# Patient Record
Sex: Male | Born: 1975 | Race: White | Hispanic: No | Marital: Married | Smoking: Never smoker
Health system: Southern US, Community
[De-identification: ages and names within clinical notes are randomized; demographics above are authoritative.]

---

## 2020-11-04 ENCOUNTER — Encounter (HOSPITAL_COMMUNITY): Payer: Self-pay | Admitting: Pharmacy Technician

## 2020-11-04 ENCOUNTER — Emergency Department (HOSPITAL_COMMUNITY)
Admission: EM | Admit: 2020-11-04 | Discharge: 2020-11-04 | Disposition: A | Discharge status: Home / Self Care | Payer: Self-pay | Attending: Physician Assistant | Admitting: Physician Assistant

## 2020-11-04 DIAGNOSIS — Z20822 Contact with and (suspected) exposure to covid-19: Secondary | ICD-10-CM | POA: Insufficient documentation

## 2020-11-04 DIAGNOSIS — R5383 Other fatigue: Secondary | ICD-10-CM | POA: Insufficient documentation

## 2020-11-04 DIAGNOSIS — R531 Weakness: Secondary | ICD-10-CM | POA: Insufficient documentation

## 2020-11-04 DIAGNOSIS — R1011 Right upper quadrant pain: Secondary | ICD-10-CM | POA: Insufficient documentation

## 2020-11-04 DIAGNOSIS — G4700 Insomnia, unspecified: Secondary | ICD-10-CM | POA: Insufficient documentation

## 2020-11-04 DIAGNOSIS — Z5321 Procedure and treatment not carried out due to patient leaving prior to being seen by health care provider: Secondary | ICD-10-CM | POA: Insufficient documentation

## 2020-11-04 DIAGNOSIS — R110 Nausea: Secondary | ICD-10-CM | POA: Insufficient documentation

## 2020-11-04 LAB — COMPREHENSIVE METABOLIC PANEL
ALT: 23 U/L (ref 0–44)
AST: 22 U/L (ref 15–41)
Albumin: 4 g/dL (ref 3.5–5.0)
Alkaline Phosphatase: 52 U/L (ref 38–126)
Anion gap: 8 (ref 5–15)
BUN: 7 mg/dL (ref 6–20)
CO2: 28 mmol/L (ref 22–32)
Calcium: 9.7 mg/dL (ref 8.9–10.3)
Chloride: 101 mmol/L (ref 98–111)
Creatinine, Ser: 1.03 mg/dL (ref 0.61–1.24)
GFR, Estimated: >60 mL/min (ref >60)
Glucose, Bld: 104 mg/dL — ABNORMAL HIGH (ref 70–99)
Potassium: 4.4 mmol/L (ref 3.5–5.1)
Sodium: 137 mmol/L (ref 135–145)
Total Bilirubin: 0.2 mg/dL — ABNORMAL LOW (ref 0.3–1.2)
Total Protein: 7.2 g/dL (ref 6.5–8.1)

## 2020-11-04 LAB — CBC
HCT: 49.4 % (ref 39.0–52.0)
Hemoglobin: 16.9 g/dL (ref 13.0–17.0)
MCH: 29.7 pg (ref 26.0–34.0)
MCHC: 34.2 g/dL (ref 30.0–36.0)
MCV: 86.8 fL (ref 80.0–100.0)
Platelets: 319 10*3/uL (ref 150–400)
RBC: 5.69 MIL/uL (ref 4.22–5.81)
RDW: 11.9 % (ref 11.5–15.5)
WBC: 9.6 10*3/uL (ref 4.0–10.5)
nRBC: 0 % (ref 0.0–0.2)

## 2020-11-04 LAB — RESP PANEL BY RT-PCR (FLU A&B, COVID) ARPGX2
Influenza A by PCR: NEGATIVE
Influenza B by PCR: NEGATIVE
SARS Coronavirus 2 by RT PCR: NEGATIVE

## 2020-11-04 LAB — LIPASE, BLOOD: Lipase: 55 U/L — ABNORMAL HIGH (ref 11–51)

## 2020-11-04 NOTE — ED Provider Notes (Signed)
Emergency Medicine Provider Triage Evaluation Note  Jennifer Holland , a 45 y.o. male  was evaluated in triage.  Pt complains of weakness, excessive sleepiness, abdominal pain with nausea.  Patient reports waking up on Monday and feeling like "someone poured hot water over him".  He reports chills, no measured fevers.  He has been feeling weak and tired.  He also complains of right upper and left upper abdominal pain.  He denies any dysuria or hematuria.  Bowel movements have been loose and diarrhea-like.  He is not vaccinated for COVID.  No known sick contacts.  He does report a history of appendectomy.  Did take at home COVID test yesterday which was negative  Review of Systems  Positive: As above Negative: As above  Physical Exam  BP (!) 140/102 (BP Location: Right Arm)   Pulse 96   Temp 98.8 F (37.1 C) (Oral)   Resp (!) 24   SpO2 100%  Gen:   Awake, no distress   Resp:  Normal effort  MSK:   Moves extremities without difficulty  Other:  Patient with right upper and left upper quadrant tenderness, positive Murphy sign  Medical Decision Making  Medically screening exam initiated at 1:54 PM.  Appropriate orders placed.  Raudel Bazen was informed that the remainder of the evaluation will be completed by another provider, this initial triage assessment does not replace that evaluation, and the importance of remaining in the ED until their evaluation is complete.     Mare Ferrari, PA-C 11/04/20 1359    Melene Plan, DO 11/04/20 1401

## 2020-11-04 NOTE — ED Triage Notes (Signed)
Pt here with RUQ abd pain along with fatigue. Denies known sick contacts.

## 2020-11-04 NOTE — ED Notes (Signed)
Pt not responding to vitals   

## 2020-11-04 NOTE — ED Notes (Signed)
Pt not responding to vital recheck  

## 2020-11-10 ENCOUNTER — Encounter (HOSPITAL_COMMUNITY): Payer: Self-pay

## 2020-11-10 ENCOUNTER — Other Ambulatory Visit: Payer: Self-pay

## 2020-11-10 ENCOUNTER — Emergency Department (HOSPITAL_COMMUNITY): Payer: Medicaid Other

## 2020-11-10 ENCOUNTER — Emergency Department (HOSPITAL_COMMUNITY)
Admission: EM | Admit: 2020-11-10 | Discharge: 2020-11-10 | Disposition: A | Discharge status: Home / Self Care | Payer: Medicaid Other | Attending: Emergency Medicine | Admitting: Emergency Medicine

## 2020-11-10 DIAGNOSIS — W540XXA Bitten by dog, initial encounter: Secondary | ICD-10-CM

## 2020-11-10 DIAGNOSIS — S61512A Laceration without foreign body of left wrist, initial encounter: Secondary | ICD-10-CM | POA: Insufficient documentation

## 2020-11-10 DIAGNOSIS — S41111A Laceration without foreign body of right upper arm, initial encounter: Secondary | ICD-10-CM | POA: Insufficient documentation

## 2020-11-10 DIAGNOSIS — S51812A Laceration without foreign body of left forearm, initial encounter: Secondary | ICD-10-CM | POA: Insufficient documentation

## 2020-11-10 DIAGNOSIS — R000 Tachycardia, unspecified: Secondary | ICD-10-CM | POA: Insufficient documentation

## 2020-11-10 MED ORDER — AMOXICILLIN-POT CLAVULANATE 875-125 MG PO TABS: 1.0000 | ORAL_TABLET | Freq: Two times a day (BID) | ORAL | 0 refills | Status: AC

## 2020-11-10 MED ORDER — SODIUM CHLORIDE 0.9 % IV SOLN
3.0000 g | Freq: Once | INTRAVENOUS | Status: AC
  Administered 2020-11-10: 3 g via INTRAVENOUS
  Filled 2020-11-10: qty 8

## 2020-11-10 MED ORDER — BUPIVACAINE HCL (PF) 0.5 % IJ SOLN
10.0000 mL | Freq: Once | INTRAMUSCULAR | Status: AC
  Administered 2020-11-10: 10 mL
  Filled 2020-11-10: qty 30

## 2020-11-10 MED ORDER — LACTATED RINGERS IV BOLUS
1000.0000 mL | Freq: Once | INTRAVENOUS | Status: AC
  Administered 2020-11-10: 1000 mL via INTRAVENOUS

## 2020-11-10 MED ORDER — MORPHINE SULFATE (PF) 4 MG/ML IV SOLN
4.0000 mg | Freq: Once | INTRAVENOUS | Status: AC
  Administered 2020-11-10: 4 mg via INTRAVENOUS
  Filled 2020-11-10: qty 1

## 2020-11-10 MED ORDER — ONDANSETRON HCL 4 MG/2ML IJ SOLN
4.0000 mg | Freq: Once | INTRAMUSCULAR | Status: AC
  Administered 2020-11-10: 4 mg via INTRAVENOUS
  Filled 2020-11-10: qty 2

## 2020-11-10 MED ORDER — LIDOCAINE-EPINEPHRINE (PF) 2 %-1:200000 IJ SOLN
10.0000 mL | Freq: Once | INTRAMUSCULAR | Status: AC
  Administered 2020-11-10: 10 mL
  Filled 2020-11-10: qty 20

## 2020-11-10 MED ORDER — HYDROCODONE-ACETAMINOPHEN 5-325 MG PO TABS: 1.0000 | ORAL_TABLET | Freq: Four times a day (QID) | ORAL | 0 refills | Status: AC | PRN

## 2020-11-10 NOTE — ED Notes (Signed)
PA at bedside suturing wounds

## 2020-11-10 NOTE — ED Triage Notes (Signed)
Arrived POV. Pt was bit by neighbors dog today around 5pm. Bite to Right inner elbow, left wrist, and left upper forearm. Bleeding is controlled, puncture wounds are covered.

## 2020-11-10 NOTE — ED Notes (Signed)
Pt to X Ray.

## 2020-11-10 NOTE — Discharge Instructions (Addendum)
  Wound Care - Laceration You may remove the bandage after 24 hours. Clean the wound and surrounding area gently with tap water and mild soap. Rinse well and blot dry. Do not scrub the wound, as this may cause the wound edges to come apart. You may shower, but avoid submerging the wound, such as with a bath or swimming. Clean the wound daily to prevent infection. Do not use cleaners such as hydrogen peroxide or alcohol.   Scar reduction: Application of a topical antibiotic ointment, such as Neosporin, after the wound has begun to close and heal well can decrease scab formation and reduce scarring. After the wound has healed and wound closures have been removed, application of ointments such as Aquaphor can also reduce scar formation.  The key to scar reduction is keeping the skin well hydrated and supple. Drinking plenty of water throughout the day (At least eight 8oz glasses of water a day) is essential to staying well hydrated.  Sun exposure: Keep the wound out of the sun. After the wound has healed, continue to protect it from the sun by wearing protective clothing or applying sunscreen.  Pain: You may use Tylenol, naproxen, or ibuprofen for pain. Antiinflammatory medications: Take 600 mg of ibuprofen every 6 hours or 440 mg (over the counter dose) to 500 mg (prescription dose) of naproxen every 12 hours for the next 3 days. After this time, these medications may be used as needed for pain. Take these medications with food to avoid upset stomach. Choose only one of these medications, do not take them together. Acetaminophen (generic for Tylenol): Should you continue to have additional pain while taking the ibuprofen or naproxen, you may add in acetaminophen as needed. Your daily total maximum amount of acetaminophen from all sources should be limited to 4000mg /day for persons without liver problems, or 2000mg /day for those with liver problems.  Vicodin: May take Vicodin (hydrocodone-acetaminophen) as  needed for severe pain.   Do not drive or perform other dangerous activities while taking this medication as it can cause drowsiness as well as changes in reaction time and judgement.   Please note that each pill of Vicodin contains 325 mg of acetaminophen (generic for Tylenol) and the above dosage limits apply.  Suture/staple removal: The sutures should dissolve and fall out within 14 days.  If they are still present after this, they may be removed.  Follow-up: Follow-up with the hand specialist on this matter.  Call to make an appointment.  Return to the ED sooner should the wound edges come apart or signs of infection arise, such as spreading redness, puffiness/swelling, pus draining from the wound, severe increase in pain, fever over 100.61F, or any other major issues.  For prescription assistance, may try using prescription discount sites or apps, such as goodrx.com

## 2020-11-10 NOTE — ED Provider Notes (Signed)
[facility]-EMERGENCY DEPT Provider Note   CSN: [id number] Arrival date & time: 11/10/20  1850     History Chief Complaint  Patient presents with  . Animal Bite    Adam Horne is a 45 y.o. male.  HPI      Adam Horne is a 45 y.o. male, patient with no known past medical history, presenting to the ED with injuries from dog bites that occurred around 5:30 PM this evening  Patient was bit multiple times by a neighbors pitbull.  He notes wounds and associated pain to the bilateral upper extremities.  The pain is worst in the left wrist and forearm, throbbing, severe, radiating into the hand. States the dog was confirmed to be up-to-date on rabies vaccination.  He confirms he is up-to-date on tetanus vaccination.  Denies numbness, weakness, other injuries.  History reviewed. No pertinent past medical history.  There are no problems to display for this patient.   History reviewed. No pertinent surgical history.     History reviewed. No pertinent family history.  Social History   Tobacco Use  . Smoking status: Never Smoker  . Smokeless tobacco: Never Used  Substance Use Topics  . Alcohol use: Never  . Drug use: Never    Home Medications Prior to Admission medications   Medication Sig Start Date End Date Taking? Authorizing Provider  amoxicillin-clavulanate (AUGMENTIN) 875-125 MG tablet Take 1 tablet by mouth every 12 (twelve) hours for 10 days. 11/10/20 11/20/20 Yes Macio Kissoon C, PA-C  HYDROcodone-acetaminophen (NORCO/VICODIN) 5-325 MG tablet Take 1 tablet by mouth every 6 (six) hours as needed for severe pain. 11/10/20  Yes Taysia Rivere C, PA-C    Allergies    Penicillins  Review of Systems   Review of Systems  Constitutional: Negative for diaphoresis.  Respiratory: Negative for shortness of breath.   Cardiovascular: Negative for chest pain.  Gastrointestinal: Negative for nausea and vomiting.  Musculoskeletal: Positive for arthralgias.  Skin:  Positive for wound.  Neurological: Negative for dizziness, weakness and numbness.  All other systems reviewed and are negative.   Physical Exam Updated Vital Signs BP (!) 169/111   Pulse (!) 132   Temp 99.6 F (37.6 C) (Oral)   Resp 18   Ht 5\' 8"  (1.727 m)   Wt 78 kg   SpO2 95%   BMI 26.15 kg/m   Physical Exam Vitals and nursing note reviewed.  Constitutional:      General: He is not in acute distress.    Appearance: He is well-developed. He is not diaphoretic.  HENT:     Head: Normocephalic and atraumatic.     Mouth/Throat:     Mouth: Mucous membranes are moist.     Pharynx: Oropharynx is clear.  Eyes:     Conjunctiva/sclera: Conjunctivae normal.  Neck:     Comments: No signs of injury to the patient's neck. Cardiovascular:     Rate and Rhythm: Regular rhythm. Tachycardia present.     Pulses: Normal pulses.          Radial pulses are 2+ on the right side and 2+ on the left side.     Comments: Tactile temperature in the extremities appropriate and equal bilaterally. Pulmonary:     Effort: Pulmonary effort is normal. No respiratory distress.  Abdominal:     Tenderness: There is no guarding.  Musculoskeletal:     Cervical back: Neck supple.     Comments: Approximately 1.5 cm laceration to the right upper extremity just proximal  to the elbow.  Surrounding bruising and tenderness noted.  1 cm laceration to the left forearm, as shown in photos.  Surrounding swelling and tenderness.  Approximately 1.5 cm laceration to the left wrist, as shown.  Surrounding swelling and tenderness.  No noted deformity or instability.  Full range of motion through the cardinal directions demonstrated in the bilateral shoulders, elbows, wrists, and fingers.  Scattered abrasions, bruising, tiny punctures to the left forearm.  Overall trauma exam performed without any abnormalities noted other than those mentioned.  Skin:    General: Skin is warm and dry.     Capillary Refill: Capillary  refill takes less than 2 seconds.  Neurological:     Mental Status: He is alert.     Comments: Sensation grossly intact to light touch through each of the nerve distributions of the bilateral upper extremities. Abduction and adduction of the fingers intact against resistance. Grip strength equal bilaterally. Supination and pronation intact against resistance. Strength 5/5 through the cardinal directions of the bilateral wrists. Strength 5/5 with flexion and extension of the bilateral elbows. Patient can touch the thumb to each one of the fingertips without difficulty.  Patient can hold the "OK" sign against resistance.  Psychiatric:        Mood and Affect: Mood and affect normal.        Speech: Speech normal.        Behavior: Behavior normal.                                      ED Results / Procedures / Treatments   Labs (all labs ordered are listed, but only abnormal results are displayed) Labs Reviewed - No data to display  EKG None  Radiology DG Forearm Left  Result Date: 11/10/2020 CLINICAL DATA:  45 year old male with dog bite to the left upper extremity. EXAM: LEFT WRIST - COMPLETE 3+ VIEW; LEFT FOREARM - 2 VIEW; LEFT HAND - COMPLETE 3+ VIEW COMPARISON:  None. FINDINGS: There is no acute fracture or dislocation. The bones are well mineralized. No arthritic changes. There is laceration of the soft tissues of the forearm and wrist with small pockets of subcutaneous air. No radiopaque foreign object. IMPRESSION: No acute/traumatic osseous pathology. Electronically Signed   By: Elgie CollardArash  Radparvar M.D.   On: 11/10/2020 20:54   DG Forearm Right  Result Date: 11/10/2020 CLINICAL DATA:  Dog bite. EXAM: RIGHT FOREARM - 2 VIEW COMPARISON:  None. FINDINGS: There is no evidence of fracture or other focal bone lesions. Soft tissues are unremarkable. No radiopaque foreign body is noted. IMPRESSION: Negative. Electronically Signed   By: Lupita RaiderJames  Green Jr M.D.    On: 11/10/2020 20:55   DG Wrist Complete Left  Result Date: 11/10/2020 CLINICAL DATA:  45 year old male with dog bite to the left upper extremity. EXAM: LEFT WRIST - COMPLETE 3+ VIEW; LEFT FOREARM - 2 VIEW; LEFT HAND - COMPLETE 3+ VIEW COMPARISON:  None. FINDINGS: There is no acute fracture or dislocation. The bones are well mineralized. No arthritic changes. There is laceration of the soft tissues of the forearm and wrist with small pockets of subcutaneous air. No radiopaque foreign object. IMPRESSION: No acute/traumatic osseous pathology. Electronically Signed   By: Elgie CollardArash  Radparvar M.D.   On: 11/10/2020 20:54   DG Humerus Right  Result Date: 11/10/2020 CLINICAL DATA:  Dog bite. EXAM: RIGHT HUMERUS - 2+ VIEW COMPARISON:  None. FINDINGS: There  is no evidence of fracture or other focal bone lesions. Soft tissues are unremarkable. No radiopaque foreign body is noted. IMPRESSION: Negative. Electronically Signed   By: Lupita Raider M.D.   On: 11/10/2020 20:56   DG Hand Complete Left  Result Date: 11/10/2020 CLINICAL DATA:  45 year old male with dog bite to the left upper extremity. EXAM: LEFT WRIST - COMPLETE 3+ VIEW; LEFT FOREARM - 2 VIEW; LEFT HAND - COMPLETE 3+ VIEW COMPARISON:  None. FINDINGS: There is no acute fracture or dislocation. The bones are well mineralized. No arthritic changes. There is laceration of the soft tissues of the forearm and wrist with small pockets of subcutaneous air. No radiopaque foreign object. IMPRESSION: No acute/traumatic osseous pathology. Electronically Signed   By: Elgie Collard M.D.   On: 11/10/2020 20:54    Procedures .Marland KitchenLaceration Repair  Date/Time: 11/10/2020 10:00 PM Performed by: Anselm Pancoast, PA-C Authorized by: Anselm Pancoast, PA-C   Consent:    Consent obtained:  Verbal   Consent given by:  Patient   Risks, benefits, and alternatives were discussed: yes     Risks discussed:  Infection, need for additional repair, nerve damage, poor wound healing,  poor cosmetic result, pain, retained foreign body, tendon damage and vascular damage Universal protocol:    Procedure explained and questions answered to patient or proxy's satisfaction: yes     Patient identity confirmed:  Verbally with patient and provided demographic data Anesthesia:    Anesthesia method:  Local infiltration   Local anesthetic:  Lidocaine 2% WITH epi and bupivacaine 0.5% w/o epi Laceration details:    Location:  Hand   Hand location:  L wrist   Length (cm):  1.5 Pre-procedure details:    Preparation:  Patient was prepped and draped in usual sterile fashion and imaging obtained to evaluate for foreign bodies Exploration:    Hemostasis achieved with:  Epinephrine   Imaging obtained: x-ray     Imaging outcome: foreign body not noted     Wound exploration: wound explored through full range of motion   Treatment:    Area cleansed with:  Povidone-iodine and saline   Amount of cleaning:  Extensive   Irrigation solution:  Sterile saline   Irrigation volume:  1000cc   Irrigation method:  Syringe   Debridement:  Minimal Skin repair:    Repair method:  Sutures   Suture size:  3-0   Wound skin closure material used: Vicryl.   Suture technique:  Simple interrupted   Number of sutures:  3 Approximation:    Approximation:  Loose Repair type:    Repair type:  Intermediate Post-procedure details:    Dressing:  Non-adherent dressing and sterile dressing   Procedure completion:  Tolerated well, no immediate complications .Marland KitchenLaceration Repair  Date/Time: 11/10/2020 10:30 PM Performed by: Anselm Pancoast, PA-C Authorized by: Anselm Pancoast, PA-C   Consent:    Consent obtained:  Verbal   Consent given by:  Patient   Risks, benefits, and alternatives were discussed: yes     Risks discussed:  Infection, need for additional repair, nerve damage, pain, poor cosmetic result, poor wound healing, retained foreign body, tendon damage and vascular damage Universal protocol:     Procedure explained and questions answered to patient or proxy's satisfaction: yes     Patient identity confirmed:  Verbally with patient and provided demographic data Anesthesia:    Anesthesia method:  Local infiltration   Local anesthetic:  Bupivacaine 0.5% w/o epi and lidocaine 2% WITH  epi Laceration details:    Location:  Shoulder/arm   Shoulder/arm location:  L lower arm   Length (cm):  1 Pre-procedure details:    Preparation:  Patient was prepped and draped in usual sterile fashion and imaging obtained to evaluate for foreign bodies Exploration:    Hemostasis achieved with:  Epinephrine   Imaging obtained: x-ray     Imaging outcome: foreign body not noted     Wound exploration: wound explored through full range of motion   Treatment:    Area cleansed with:  Povidone-iodine and saline   Amount of cleaning:  Extensive   Irrigation solution:  Sterile saline   Irrigation volume:  1000cc   Irrigation method:  Syringe   Debridement:  None Skin repair:    Repair method:  Sutures   Suture size:  3-0   Wound skin closure material used: Vicryl.   Suture technique:  Simple interrupted   Number of sutures:  1 Approximation:    Approximation:  Loose Repair type:    Repair type:  Intermediate Post-procedure details:    Dressing:  Non-adherent dressing and sterile dressing   Procedure completion:  Tolerated well, no immediate complications Comments:       Marland KitchenMarland KitchenLaceration Repair  Date/Time: 11/10/2020 10:45 PM Performed by: Anselm Pancoast, PA-C Authorized by: Anselm Pancoast, PA-C   Consent:    Consent obtained:  Verbal   Consent given by:  Patient   Risks, benefits, and alternatives were discussed: yes     Risks discussed:  Infection, need for additional repair, nerve damage, poor wound healing, poor cosmetic result, pain, retained foreign body, vascular damage and tendon damage Universal protocol:    Procedure explained and questions answered to patient or proxy's satisfaction: yes      Patient identity confirmed:  Verbally with patient and provided demographic data Anesthesia:    Anesthesia method:  Local infiltration   Local anesthetic:  Lidocaine 2% WITH epi and bupivacaine 0.5% w/o epi Laceration details:    Location:  Shoulder/arm   Shoulder/arm location:  R upper arm   Length (cm):  1.5 Pre-procedure details:    Preparation:  Patient was prepped and draped in usual sterile fashion and imaging obtained to evaluate for foreign bodies Exploration:    Hemostasis achieved with:  Epinephrine   Imaging obtained: x-ray     Imaging outcome: foreign body not noted     Wound exploration: wound explored through full range of motion   Treatment:    Area cleansed with:  Povidone-iodine and saline   Amount of cleaning:  Extensive   Irrigation solution:  Sterile saline   Irrigation volume:  1000cc   Irrigation method:  Syringe   Debridement:  Minimal Skin repair:    Repair method:  Sutures   Suture size:  3-0   Wound skin closure material used: Vicryl.   Suture technique:  Simple interrupted   Number of sutures:  3 Approximation:    Approximation:  Loose Repair type:    Repair type:  Complex Post-procedure details:    Dressing:  Non-adherent dressing and sterile dressing   Procedure completion:  Tolerated well, no immediate complications     Medications Ordered in ED Medications  lactated ringers bolus 1,000 mL (0 mLs Intravenous Stopped 11/10/20 2253)  morphine 4 MG/ML injection 4 mg (4 mg Intravenous Given 11/10/20 2018)  ondansetron (ZOFRAN) injection 4 mg (4 mg Intravenous Given 11/10/20 2018)  bupivacaine (MARCAINE) 0.5 % injection 10 mL (10 mLs Infiltration Given 11/10/20  2020)  lidocaine-EPINEPHrine (XYLOCAINE W/EPI) 2 %-1:200000 (PF) injection 10 mL (10 mLs Infiltration Given 11/10/20 2020)  Ampicillin-Sulbactam (UNASYN) 3 g in sodium chloride 0.9 % 100 mL IVPB (0 g Intravenous Stopped 11/10/20 2147)    ED Course  I have reviewed the triage vital signs  and the nursing notes.  Pertinent labs & imaging results that were available during my care of the patient were reviewed by me and considered in my medical decision making (see chart for details).  Clinical Course as of 11/11/20 1751  Wed Nov 10, 2020  2006 Spoke with Corrie Dandy, pharmacist. Gustavus Bryant the patient has taken amoxicillin in the past without issue, he should be able to receive Unasyn as well. [SJ]  2140 Spoke with Dr. Arita Miss with hand surgery.  We discussed the patient's injuries, x-ray results, physical exam findings, as well as my concern for the depth of the patient's wounds. Dr. Arita Miss acknowledged this concern and recommends bedside washout by [place] here in the ED, antibiotics, and close follow-up in the office.  He will make his office team aware and will see the patient late this week or early next week. [SJ]    Clinical Course User Index [SJ] Rowan Blaker, Hillard Danker, PA-C   MDM Rules/Calculators/A&P                          Patient presents with dog bites to the upper extremities.  He does not seem to have explicit evidence of neurovascular compromise. Wounds were assessed and loosely repaired, as needed.  The smaller wounds that did not need to be repaired were washed and flushed extensively. Patient will have close follow-up in the office with hand surgery. The patient was given instructions for home care as well as return precautions. Patient voices understanding of these instructions, accepts the plan, and is comfortable with discharge.   Final Clinical Impression(s) / ED Diagnoses Final diagnoses:  Dog bite, initial encounter    Rx / DC Orders ED Discharge Orders         Ordered    amoxicillin-clavulanate (AUGMENTIN) 875-125 MG tablet  Every 12 hours        11/10/20 2257    HYDROcodone-acetaminophen (NORCO/VICODIN) 5-325 MG tablet  Every 6 hours PRN        11/10/20 2257           Anselm Pancoast, PA-C 11/11/20 1753    Wynetta Fines, MD 11/17/20 [phone]

## 2020-11-11 ENCOUNTER — Encounter (HOSPITAL_COMMUNITY): Payer: Self-pay | Admitting: Pharmacy Technician

## 2020-11-13 ENCOUNTER — Emergency Department: Payer: Self-pay

## 2020-11-13 ENCOUNTER — Encounter: Payer: Self-pay | Admitting: Emergency Medicine

## 2020-11-13 ENCOUNTER — Other Ambulatory Visit: Payer: Self-pay

## 2020-11-13 ENCOUNTER — Emergency Department
Admission: EM | Admit: 2020-11-13 | Discharge: 2020-11-13 | Disposition: A | Discharge status: Home / Self Care | Payer: Self-pay | Attending: Emergency Medicine | Admitting: Emergency Medicine

## 2020-11-13 DIAGNOSIS — R911 Solitary pulmonary nodule: Secondary | ICD-10-CM | POA: Insufficient documentation

## 2020-11-13 DIAGNOSIS — R0602 Shortness of breath: Secondary | ICD-10-CM

## 2020-11-13 DIAGNOSIS — Z20822 Contact with and (suspected) exposure to covid-19: Secondary | ICD-10-CM | POA: Insufficient documentation

## 2020-11-13 DIAGNOSIS — Z28310 Unvaccinated for covid-19: Secondary | ICD-10-CM | POA: Insufficient documentation

## 2020-11-13 LAB — COMPREHENSIVE METABOLIC PANEL
ALT: 27 U/L (ref 0–44)
AST: 24 U/L (ref 15–41)
Albumin: 4 g/dL (ref 3.5–5.0)
Alkaline Phosphatase: 54 U/L (ref 38–126)
Anion gap: 9 (ref 5–15)
BUN: 11 mg/dL (ref 6–20)
CO2: 29 mmol/L (ref 22–32)
Calcium: 9 mg/dL (ref 8.9–10.3)
Chloride: 102 mmol/L (ref 98–111)
Creatinine, Ser: 0.94 mg/dL (ref 0.61–1.24)
GFR, Estimated: >60 mL/min (ref >60)
Glucose, Bld: 103 mg/dL — ABNORMAL HIGH (ref 70–99)
Potassium: 4 mmol/L (ref 3.5–5.1)
Sodium: 140 mmol/L (ref 135–145)
Total Bilirubin: 0.9 mg/dL (ref 0.3–1.2)
Total Protein: 7.3 g/dL (ref 6.5–8.1)

## 2020-11-13 LAB — CBC WITH DIFFERENTIAL/PLATELET
Abs Immature Granulocytes: 0.08 10*3/uL — ABNORMAL HIGH (ref 0.00–0.07)
Basophils Absolute: 0 10*3/uL (ref 0.0–0.1)
Basophils Relative: 0 %
Eosinophils Absolute: 0.7 10*3/uL — ABNORMAL HIGH (ref 0.0–0.5)
Eosinophils Relative: 6 %
HCT: 41.1 % (ref 39.0–52.0)
Hemoglobin: 14.4 g/dL (ref 13.0–17.0)
Immature Granulocytes: 1 %
Lymphocytes Relative: 22 %
Lymphs Abs: 2.3 10*3/uL (ref 0.7–4.0)
MCH: 30.2 pg (ref 26.0–34.0)
MCHC: 35 g/dL (ref 30.0–36.0)
MCV: 86.2 fL (ref 80.0–100.0)
Monocytes Absolute: 0.7 10*3/uL (ref 0.1–1.0)
Monocytes Relative: 7 %
Neutro Abs: 6.7 10*3/uL (ref 1.7–7.7)
Neutrophils Relative %: 64 %
Platelets: 283 10*3/uL (ref 150–400)
RBC: 4.77 MIL/uL (ref 4.22–5.81)
RDW: 12 % (ref 11.5–15.5)
WBC: 10.5 10*3/uL (ref 4.0–10.5)
nRBC: 0 % (ref 0.0–0.2)

## 2020-11-13 LAB — RESP PANEL BY RT-PCR (FLU A&B, COVID) ARPGX2
Influenza A by PCR: NEGATIVE
Influenza B by PCR: NEGATIVE
SARS Coronavirus 2 by RT PCR: NEGATIVE

## 2020-11-13 LAB — BRAIN NATRIURETIC PEPTIDE: B Natriuretic Peptide: 14.1 pg/mL (ref 0.0–100.0)

## 2020-11-13 LAB — TROPONIN I (HIGH SENSITIVITY): Troponin I (High Sensitivity): 3 ng/L (ref <18)

## 2020-11-13 LAB — D-DIMER, QUANTITATIVE: D-Dimer, Quant: 0.29 ug/mL-FEU (ref 0.00–0.50)

## 2020-11-13 MED ORDER — ALBUTEROL SULFATE HFA 108 (90 BASE) MCG/ACT IN AERS: INHALATION_SPRAY | RESPIRATORY_TRACT | 0 refills | Status: AC

## 2020-11-13 MED ORDER — IPRATROPIUM-ALBUTEROL 0.5-2.5 (3) MG/3ML IN SOLN
3.0000 mL | Freq: Once | RESPIRATORY_TRACT | Status: AC
  Administered 2020-11-13: 3 mL via RESPIRATORY_TRACT
  Filled 2020-11-13: qty 3

## 2020-11-13 MED ORDER — PREDNISONE 20 MG PO TABS: 60.0000 mg | ORAL_TABLET | Freq: Every day | ORAL | 0 refills | Status: AC

## 2020-11-13 NOTE — ED Triage Notes (Signed)
Pt reports that he developed [facility] a few hours PTA. He is able to talk in complete sentences. He had a COVID test yesterday it was negative

## 2020-11-13 NOTE — ED Notes (Signed)
Pt provided with emesis bag for phlegm.

## 2020-11-13 NOTE — ED Provider Notes (Signed)
[facility] Emergency Department Provider Note  ____________________________________________   Event Date/Time   First MD Initiated Contact with Patient 11/13/20 0407     (approximate)  I have reviewed the triage vital signs and the nursing notes.   HISTORY  Chief Complaint Shortness of Breath    HPI Adam Horne Reason is a 45 y.o. male he denies having any chronic medical history including no prior pulmonary issues.  He presents for evaluation of episodic shortness of breath of the last 3 days.  He said it is always worse at night although he feels like he is wheezing during the day as well.  Exertion does not seem to make his symptoms worse but lying flat does.  He has had no recent trauma.  He has no history of blood clots in the legs of the lungs.  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  He has had no chest pain, fever/chills, sore throat, nausea, vomiting, nor abdominal pain.  He has not received his COVID-19 vaccination.  He was seen at the [facility] emergency department about 3 days ago after a dog bite episode and he was not feeling short of breath at the time so he did not mention it to them. He had a negative COVID-19 PCR test about 9 days ago.          History reviewed. No pertinent past medical history.  There are no problems to display for this patient.   History reviewed. No pertinent surgical history.  Prior to Admission medications   Medication Sig Start Date End Date Taking? Authorizing Provider  albuterol (VENTOLIN HFA) 108 (90 Base) MCG/ACT inhaler Inhale 2-4 puffs by mouth every 4 hours as needed for wheezing, cough, and/or shortness of breath 11/13/20  Yes Loleta Rose, MD  predniSONE (DELTASONE) 20 MG tablet Take 3 tablets (60 mg total) by mouth daily with breakfast for 5 days. 11/13/20 11/18/20 Yes Loleta Rose, MD  amoxicillin-clavulanate (AUGMENTIN) 875-125 MG tablet Take 1 tablet by mouth every 12  (twelve) hours for 10 days. 11/10/20 11/20/20  Joy, Hillard Danker, PA-C  HYDROcodone-acetaminophen (NORCO/VICODIN) 5-325 MG tablet Take 1 tablet by mouth every 6 (six) hours as needed for severe pain. 11/10/20   Joy, Shawn C, PA-C    Allergies Penicillins  No family history on file.  Social History Social History   Tobacco Use  . Smoking status: Never Smoker  . Smokeless tobacco: Never Used  Substance Use Topics  . Alcohol use: Never  . Drug use: Never    Review of Systems Constitutional: No fever/chills Eyes: No visual changes. ENT: No sore throat. Cardiovascular: Denies chest pain. Respiratory: Positive for shortness of breath as described above. Gastrointestinal: No abdominal pain.  No nausea, no vomiting.  No diarrhea.  No constipation. Genitourinary: Negative for dysuria. Musculoskeletal: Negative for neck pain.  Negative for back pain. Integumentary: Negative for rash. Neurological: Negative for headaches, focal weakness or numbness.   ____________________________________________   PHYSICAL EXAM:  VITAL SIGNS: ED Triage Vitals  Enc Vitals Group     BP 11/13/20 0418 107/81     Pulse Rate 11/13/20 0400 (!) 101     Resp 11/13/20 0400 (!) 22     Temp 11/13/20 0400 98 F (36.7 C)     Temp src --      SpO2 11/13/20 0415 99 %     Weight 11/13/20 0401 78 kg (171 lb 15.3 oz)  Height 11/13/20 0401 1.727 m (5\' 8" )     Head Circumference --      Peak Flow --      Pain Score 11/13/20 0401 0     Pain Loc --      Pain Edu? --      Excl. in GC? --     Constitutional: Alert and oriented.  Eyes: Conjunctivae are normal.  Head: Atraumatic. Nose: No congestion/rhinnorhea. Mouth/Throat: Patient is wearing a mask. Neck: No stridor.  No meningeal signs.   Cardiovascular: Normal rate, regular rhythm. Good peripheral circulation. Respiratory: Mild respiratory distress, the patient keeps taking large breaths using accessory muscles every few seconds but in between those breaths  he does not seem to be in any significant distress.  He is speaking in slightly shorter than usual sentences.  Upon auscultation, he has expiratory wheezing throughout and some inspiratory wheezing with some coarse breath sounds particularly in the bases. Gastrointestinal: Soft and nontender. No distention.  Musculoskeletal: No lower extremity tenderness nor edema. No gross deformities of extremities. Neurologic:  Normal speech and language. No gross focal neurologic deficits are appreciated.  Skin:  Skin is warm, dry and intact. Psychiatric: Mood and affect are slightly anxious but generally appropriate under the circumstances.  ____________________________________________   LABS (all labs ordered are listed, but only abnormal results are displayed)  Labs Reviewed  CBC WITH DIFFERENTIAL/PLATELET - Abnormal; Notable for the following components:      Result Value   Eosinophils Absolute 0.7 (*)    Abs Immature Granulocytes 0.08 (*)    All other components within normal limits  COMPREHENSIVE METABOLIC PANEL - Abnormal; Notable for the following components:   Glucose, Bld 103 (*)    All other components within normal limits  RESP PANEL BY RT-PCR (FLU A&B, COVID) ARPGX2  BRAIN NATRIURETIC PEPTIDE  D-DIMER, QUANTITATIVE  TROPONIN I (HIGH SENSITIVITY)   ____________________________________________  EKG  ED ECG REPORT I, Loleta Roseory Weronika Birch, the attending physician, personally viewed and interpreted this ECG.  Date: 11/13/2020 EKG Time: 4:31 AM Rate: 104 Rhythm: Sinus tachycardia QRS Axis: normal Intervals: normal ST/T Wave abnormalities: Mild global ST segment elevation, does not meet criteria for STEMI, but suggestive of either early repull or perhaps even a pericarditis pattern. Narrative Interpretation: no definitive evidence of acute ischemia; does not meet STEMI criteria.   ____________________________________________  RADIOLOGY I, Loleta Roseory Carrel Leather, personally viewed and evaluated  these images (plain radiographs) as part of my medical decision making, as well as reviewing the written report by the radiologist.  ED MD interpretation: No acute abnormalities identified on chest x-ray  Official radiology report(s): DG Chest 2 View  Result Date: 11/13/2020 CLINICAL DATA:  Shortness of breath.  Orthopnea. EXAM: CHEST - 2 VIEW COMPARISON:  None. FINDINGS: Normal heart size and mediastinal contours. No acute infiltrate or edema. 8 mm nodular density over the lateral left chest. No effusion or pneumothorax. No acute osseous findings. Remote and healed sixth rib fracture on the left IMPRESSION: 1. Negative for edema or pneumonia. 2. 8 mm nodular density over the left lung. In a young never smoker, consider follow-up chest radiograph after resolution of symptoms rather than noncontrast chest CT. Electronically Signed   By: Marnee SpringJonathon  Watts M.D.   On: 11/13/2020 05:07    ____________________________________________   PROCEDURES   Procedure(s) performed (including Critical Care):  .1-3 Lead EKG Interpretation Performed by: Loleta RoseForbach, Kenlynn Houde, MD Authorized by: Loleta RoseForbach, Keyante Durio, MD     Interpretation: abnormal     ECG rate:  105   ECG rate assessment: tachycardic     Rhythm: sinus tachycardia     Ectopy: none     Conduction: normal       ____________________________________________   INITIAL IMPRESSION / MDM / ASSESSMENT AND PLAN / ED COURSE  As part of my medical decision making, I reviewed the following data within the electronic MEDICAL RECORD NUMBER Nursing notes reviewed and incorporated, Labs reviewed , EKG interpreted , Old chart reviewed, Radiograph reviewed  and Notes from prior ED visits   Differential diagnosis includes, but is not limited to, COVID-19, other nonspecific respiratory viral infection, community-acquired pneumonia, pneumothorax, PE, ACS, pericarditis/myocarditis, undiagnosed COPD, undiagnosed asthma, new onset CHF.  No pain complaints, including no chest  pain.  Low risk for PE but I will check a D-dimer to further risk stratify.  Radiograph pending.  I will try DuoNeb to see if this helps with the symptoms.  The patient is on the cardiac monitor to evaluate for evidence of arrhythmia and/or significant heart rate changes.     Clinical Course as of 11/13/20 0726  Sat Nov 13, 2020  0528 SARS Coronavirus 2 by RT PCR: NEGATIVE [CF]  0529 I personally reviewed the patient's imaging and agree with the radiologist's interpretation that there is a small nodule but otherwise no explanation for the patient's symptoms including no evidence of pulmonary edema or pneumonia. [CF]  0720 Patient has been sleeping.  At no point has he been hypoxemic.  He has stable vital signs.  I woke him up to let him know about his results.  He said he feels generally weak but no longer feels short of breath.  Upon auscultation, he no longer has any wheezing and it seems that the DuoNeb treatment helped substantially.  Explained there is no evidence of an emergent finding right now that requires hospitalization but I will provide follow-up information with pulmonology.  I will also write prescriptions as listed below since he seems to be having almost a COPD exacerbation even though he has no history of COPD.  I am also giving him follow-up with the pulmonary nodule clinic.  He says he understands and agrees with the plan.  I gave my usual and customary return precautions. [CF]    Clinical Course User Index [CF] Loleta Rose, MD     ____________________________________________  FINAL CLINICAL IMPRESSION(S) / ED DIAGNOSES  Final diagnoses:  SOB (shortness of breath)  Pulmonary nodule     MEDICATIONS GIVEN DURING THIS VISIT:  Medications  ipratropium-albuterol (DUONEB) 0.5-2.5 (3) MG/3ML nebulizer solution 3 mL (3 mLs Nebulization Given 11/13/20 0559)     ED Discharge Orders         Ordered    AMB  Referral to Pulmonary Nodule Clinic        11/13/20 0722     predniSONE (DELTASONE) 20 MG tablet  Daily with breakfast        11/13/20 0725    albuterol (VENTOLIN HFA) 108 (90 Base) MCG/ACT inhaler       Note to Pharmacy: Pharmacy may substitute brand and size for insurance-approved equivalent   11/13/20 0725          *Please note:  Aswad Wandrey was evaluated in Emergency Department on 11/13/2020 for the symptoms described in the history of present illness. He was evaluated in the context of the global COVID-19 pandemic, which necessitated consideration that the patient might be at risk for infection with the SARS-CoV-2 virus that causes COVID-19. Institutional protocols and algorithms  that pertain to the evaluation of patients at risk for COVID-19 are in a state of rapid change based on information released by regulatory bodies including the CDC and federal and state organizations. These policies and algorithms were followed during the patient's care in the ED.  Some ED evaluations and interventions may be delayed as a result of limited staffing during and after the pandemic.*  Note:  This document was prepared using Dragon voice recognition software and may include unintentional dictation errors.   Loleta Rose, MD 11/13/20 [id number]

## 2020-11-13 NOTE — Discharge Instructions (Signed)
As we discussed, your work-up was generally reassuring.  We did not identify a specific cause for your symptoms even though you responded to treatment as if you have COPD even though you do not have a diagnosis.  Please try the medications prescribed according to the label instructions.  We recommend you call the office of Dr. Karna Christmas to establish care with a pulmonologist who may be able to help you further with your lung issues.  In addition, even though the chest x-ray was generally reassuring, you have a pulmonary nodule.  These are common and do not necessarily mean anything dangerous is going on, but it is important to follow-up, particularly with your history in the [place].  We have provided a referral to the pulmonary nodule clinic who can help follow-up and track any possible progression of the nodule.    Return to the emergency department if you develop new or worsening symptoms that concern you.

## 2020-11-17 ENCOUNTER — Encounter: Payer: Self-pay | Admitting: *Deleted

## 2021-01-12 ENCOUNTER — Telehealth: Payer: Self-pay | Admitting: *Deleted

## 2021-01-12 DIAGNOSIS — R918 Other nonspecific abnormal finding of lung field: Secondary | ICD-10-CM

## 2021-01-12 DIAGNOSIS — R9389 Abnormal findings on diagnostic imaging of other specified body structures: Secondary | ICD-10-CM

## 2021-01-12 NOTE — Telephone Encounter (Signed)
Referral received for patient. Contacted and left message to schedule upcoming appts for follow up in the lung nodule clinic. Awaiting call back.

## 2022-07-09 IMAGING — CR DG FOREARM 2V*L*
2 series · 2 of 2 positions shown · non-contrast
Comparison: None.

CLINICAL DATA: 45-year-old male with dog bite to the left upper
extremity.

EXAM:
LEFT WRIST - COMPLETE 3+ VIEW; LEFT FOREARM - 2 VIEW; LEFT HAND -
COMPLETE 3+ VIEW

[x forearm lat left]
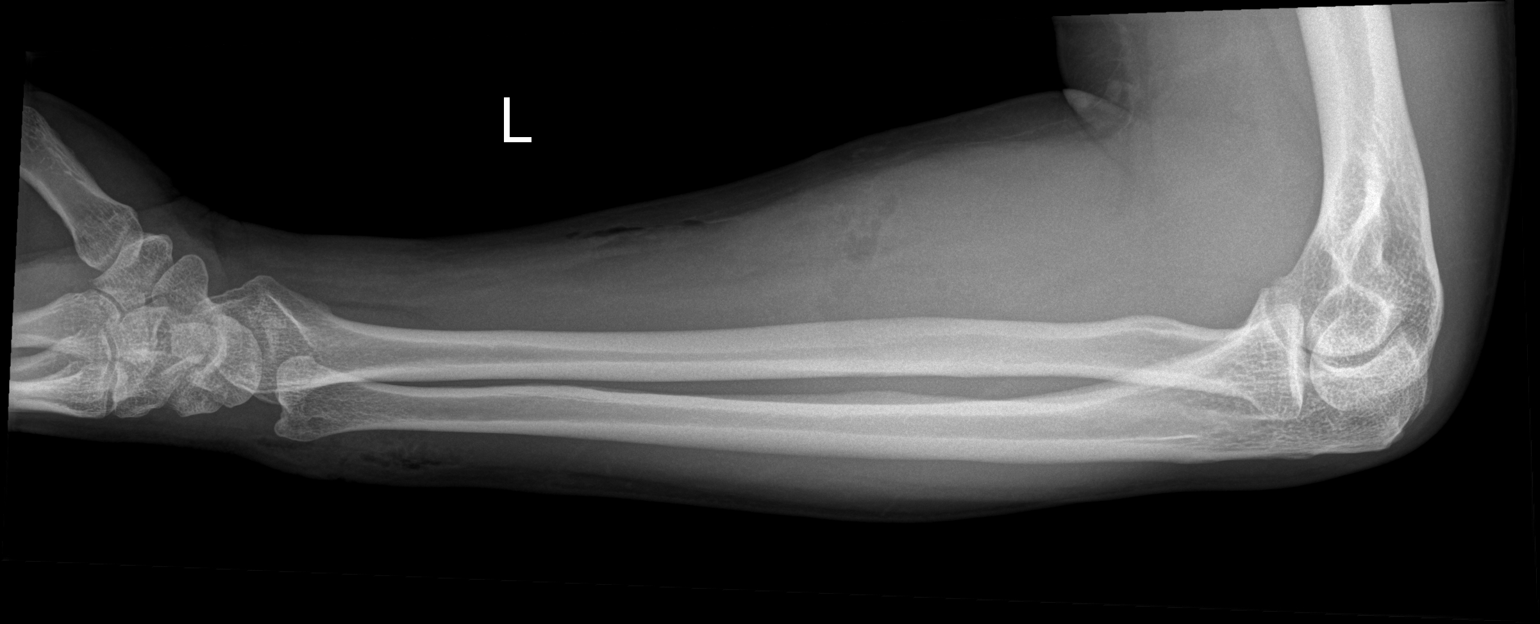

[x forearm ap left]
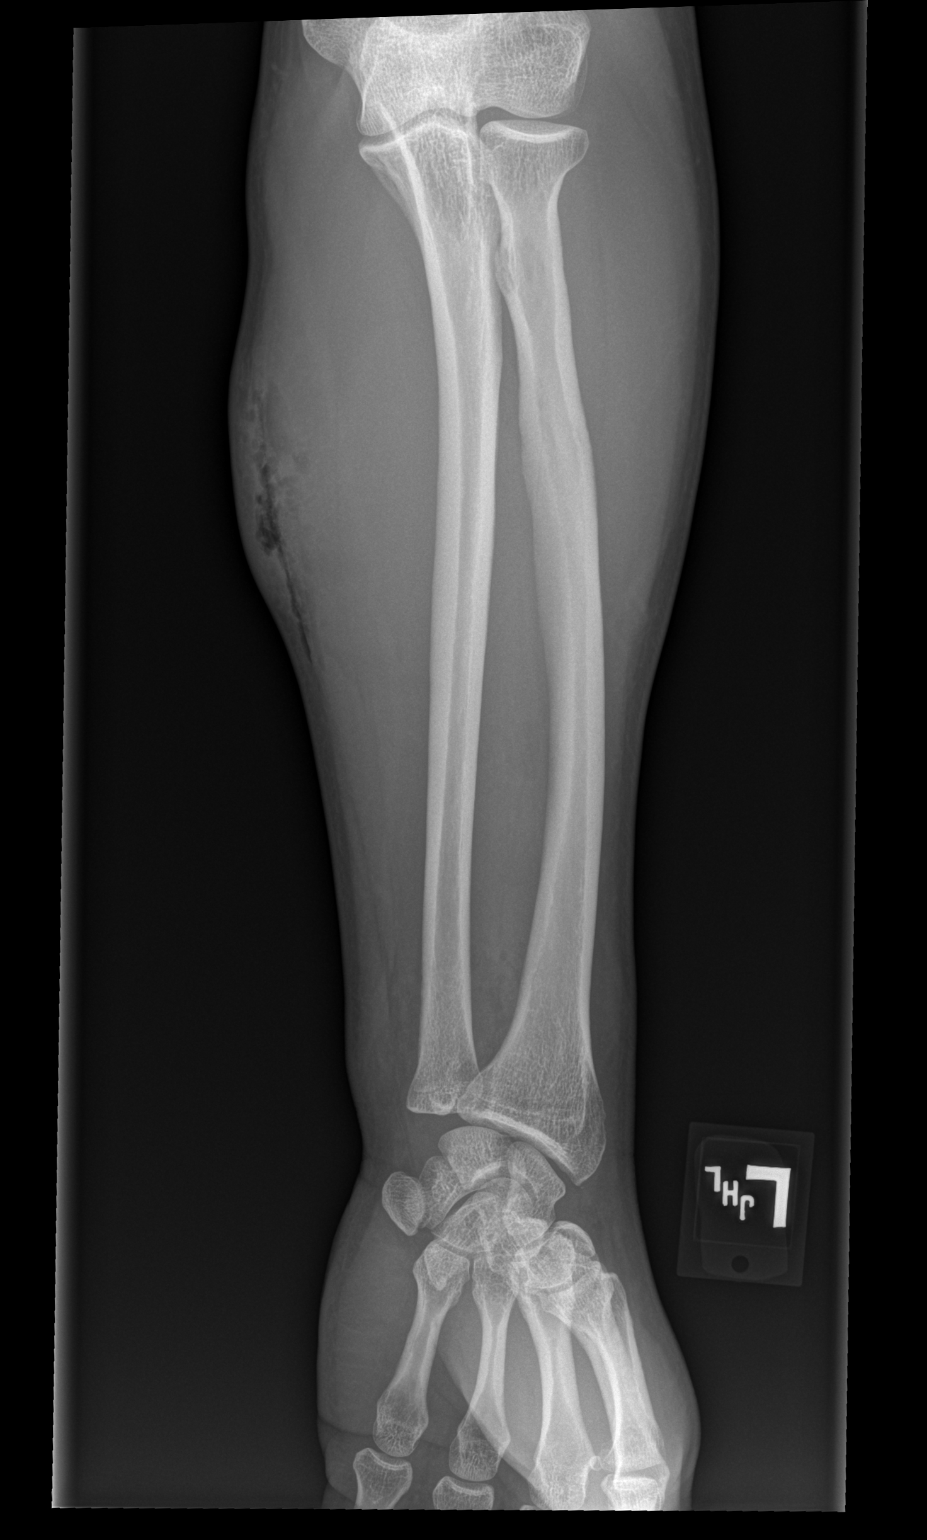

[2 of 2 positions shown; findings below may reference images not displayed]

FINDINGS: There is no acute fracture or dislocation. The bones are well
mineralized. No arthritic changes. There is laceration of the soft
tissues of the forearm and wrist with small pockets of subcutaneous
air. No radiopaque foreign object.
IMPRESSION: No acute/traumatic osseous pathology.
